# Patient Record
Sex: Female | Born: 1995 | Race: Black or African American | Hispanic: No | Marital: Single | Smoking: Never smoker
Health system: Southern US, Community
[De-identification: ages and names within clinical notes are randomized; demographics above are authoritative.]

## PROBLEM LIST (undated history)

## (undated) HISTORY — PX: NO PAST SURGERIES: SHX2092

---
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
## 2014-01-14 ENCOUNTER — Ambulatory Visit: Payer: Self-pay | Admitting: Pediatrics

## 2015-01-23 ENCOUNTER — Emergency Department: Payer: Self-pay | Admitting: Emergency Medicine

## 2015-01-26 ENCOUNTER — Ambulatory Visit: Payer: Self-pay | Admitting: Pediatrics

## 2015-09-19 ENCOUNTER — Encounter: Payer: Self-pay | Admitting: Emergency Medicine

## 2015-09-19 ENCOUNTER — Ambulatory Visit
Admission: EM | Admit: 2015-09-19 | Discharge: 2015-09-19 | Disposition: A | Discharge status: Home / Self Care | Payer: No Typology Code available for payment source | Attending: Family Medicine | Admitting: Family Medicine

## 2015-09-19 DIAGNOSIS — J029 Acute pharyngitis, unspecified: Secondary | ICD-10-CM | POA: Diagnosis not present

## 2015-09-19 LAB — RAPID STREP SCREEN (MED CTR MEBANE ONLY): Streptococcus, Group A Screen (Direct): NEGATIVE

## 2015-09-19 NOTE — Discharge Instructions (Signed)

## 2015-09-19 NOTE — ED Provider Notes (Signed)
CSN: [id number]     Arrival date & time 09/19/15  1518 History   First MD Initiated Contact with Patient 09/19/15 1617     Chief Complaint  Patient presents with  . Sore Throat  . Cough  . Headache   (Consider location/radiation/quality/duration/timing/severity/associated sxs/prior Treatment) HPI  19 year old female presents to urgent care today with complaints of sore throat, cough, runny nose.  Patient reports that she's had these symptoms since this past Wednesday.  She reports associated tactile fever but has not taken her temperature. She also reports associated chills. No exacerbating or relieving factors. She is in college so she may have had some sick contacts.   History reviewed. No pertinent past medical history.   History reviewed. No pertinent past surgical history.   History reviewed. No pertinent family history. Social History  Substance Use Topics  . Smoking status: Never Smoker   . Smokeless tobacco: None  . Alcohol Use: No   OB History    No data available     Review of Systems  Constitutional: Positive for fever and chills.  HENT: Positive for sore throat.   Respiratory: Positive for cough.   All other systems negative.   Allergies  Review of patient's allergies indicates no known allergies.  Home Medications   Prior to Admission medications   Medication Sig Start Date End Date Taking? Authorizing Provider  norgestimate-ethinyl estradiol (ORTHO-CYCLEN,SPRINTEC,PREVIFEM) 0.25-35 MG-MCG tablet Take 1 tablet by mouth daily.   Yes Historical Provider, MD   Meds Ordered and Administered this Visit  Medications - No data to display  BP 151/75 mmHg  Pulse 100  Temp(Src) 97.5 F (36.4 C) (Oral)  Resp 16  Ht 5\' 7"  (1.702 m)  Wt 220 lb (99.791 kg)  BMI 34.45 kg/m2  SpO2 100%  LMP 09/18/2015 (Exact Date) No data found.  Physical Exam  Constitutional: She appears well-developed and well-nourished. No distress.  HENT:  Head: Normocephalic and  atraumatic.  Normal TMs bilaterally. Oropharynx mildly erythematous.  Eyes: Conjunctivae are normal. Right eye exhibits no discharge. Left eye exhibits no discharge.  Neck: Neck supple.  Cardiovascular: Normal rate and regular rhythm.   Soft systolic murmur.  Pulmonary/Chest: Effort normal. She has no wheezes. She has no rales.  Musculoskeletal: Normal range of motion.  Lymphadenopathy:    She has no cervical adenopathy.  Neurological: She is alert.  Skin: Skin is warm and dry. No rash noted.  Psychiatric: She has a normal mood and affect.  Vitals reviewed.  ED Course  Procedures (including critical care time)  Labs Review Labs Reviewed  RAPID STREP SCREEN (NOT AT [facility])  CULTURE, GROUP A STREP ([facility] ONLY)   Imaging Review No results found.  MDM   1. Pharyngitis    Well-appearing on exam. Exam unremarkable. Rapid strep negative. Sending culture. This is likely viral illness. Advised supportive care with rest, IV Profen and Tylenol for pain/discomfort. Follow-up if worsens or fails to improve.    Tommie SamsJayce G Ulises Wolfinger, DO 09/19/15 [id number]

## 2015-09-19 NOTE — ED Notes (Signed)
Patient c/o sore throat, stuffy nose, HAs, and cough since Wed.

## 2015-09-21 LAB — CULTURE, GROUP A STREP (THRC)

## 2015-09-21 NOTE — ED Notes (Signed)
Final report of strep testing negative  

## 2016-07-16 ENCOUNTER — Emergency Department: Payer: No Typology Code available for payment source

## 2016-07-16 ENCOUNTER — Emergency Department
Admission: EM | Admit: 2016-07-16 | Discharge: 2016-07-16 | Disposition: A | Discharge status: Home / Self Care | Payer: No Typology Code available for payment source | Attending: Emergency Medicine | Admitting: Emergency Medicine

## 2016-07-16 ENCOUNTER — Encounter: Payer: Self-pay | Admitting: Emergency Medicine

## 2016-07-16 DIAGNOSIS — S50812A Abrasion of left forearm, initial encounter: Secondary | ICD-10-CM | POA: Diagnosis not present

## 2016-07-16 DIAGNOSIS — S93401A Sprain of unspecified ligament of right ankle, initial encounter: Secondary | ICD-10-CM | POA: Diagnosis not present

## 2016-07-16 DIAGNOSIS — S20212A Contusion of left front wall of thorax, initial encounter: Secondary | ICD-10-CM | POA: Diagnosis not present

## 2016-07-16 DIAGNOSIS — Y92410 Unspecified street and highway as the place of occurrence of the external cause: Secondary | ICD-10-CM | POA: Diagnosis not present

## 2016-07-16 DIAGNOSIS — Y9389 Activity, other specified: Secondary | ICD-10-CM | POA: Diagnosis not present

## 2016-07-16 DIAGNOSIS — T07XXXA Unspecified multiple injuries, initial encounter: Secondary | ICD-10-CM

## 2016-07-16 DIAGNOSIS — Y999 Unspecified external cause status: Secondary | ICD-10-CM | POA: Diagnosis not present

## 2016-07-16 DIAGNOSIS — M25511 Pain in right shoulder: Secondary | ICD-10-CM | POA: Diagnosis present

## 2016-07-16 MED ORDER — KETOROLAC TROMETHAMINE 10 MG PO TABS: 10.0000 mg | ORAL_TABLET | Freq: Three times a day (TID) | ORAL | 0 refills | Status: DC

## 2016-07-16 MED ORDER — CYCLOBENZAPRINE HCL 5 MG PO TABS: 5.0000 mg | ORAL_TABLET | Freq: Three times a day (TID) | ORAL | 0 refills | Status: DC | PRN

## 2016-07-16 MED ORDER — CYCLOBENZAPRINE HCL 10 MG PO TABS
5.0000 mg | ORAL_TABLET | Freq: Once | ORAL | Status: AC
  Administered 2016-07-16: 5 mg via ORAL
  Filled 2016-07-16: qty 1

## 2016-07-16 MED ORDER — HYDROCODONE-ACETAMINOPHEN 5-325 MG PO TABS: 1.0000 | ORAL_TABLET | Freq: Four times a day (QID) | ORAL | 0 refills | Status: DC | PRN

## 2016-07-16 MED ORDER — KETOROLAC TROMETHAMINE 60 MG/2ML IM SOLN
30.0000 mg | Freq: Once | INTRAMUSCULAR | Status: AC
  Administered 2016-07-16: 30 mg via INTRAMUSCULAR
  Filled 2016-07-16: qty 2

## 2016-07-16 NOTE — Discharge Instructions (Signed)
Take the prescription meds for pain, spasm, and inflammation as directed. Apply ice to any sore or swollen areas. Apply antibiotic ointment to all abrasions, daily, to promote healing. Follow-up with [facility] for any ongoing symptoms.
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
## 2018-06-11 ENCOUNTER — Ambulatory Visit: Payer: BLUE CROSS/BLUE SHIELD | Admitting: Family Medicine

## 2018-06-11 ENCOUNTER — Encounter: Payer: Self-pay | Admitting: Family Medicine

## 2018-06-11 VITALS — BP 120/80 | HR 76 | Ht 65.5 in | Wt 226.0 lb

## 2018-06-11 DIAGNOSIS — Z23 Encounter for immunization: Secondary | ICD-10-CM | POA: Diagnosis not present

## 2018-06-11 DIAGNOSIS — Z3009 Encounter for other general counseling and advice on contraception: Secondary | ICD-10-CM | POA: Diagnosis not present

## 2018-06-11 DIAGNOSIS — N926 Irregular menstruation, unspecified: Secondary | ICD-10-CM | POA: Diagnosis not present

## 2018-06-11 DIAGNOSIS — Z7689 Persons encountering health services in other specified circumstances: Secondary | ICD-10-CM

## 2018-06-11 LAB — POCT URINE PREGNANCY: PREG TEST UR: NEGATIVE

## 2018-06-11 NOTE — Patient Instructions (Signed)
Contraception Choices Contraception, also called birth control, refers to methods or devices that prevent pregnancy. Hormonal methods Contraceptive implant A contraceptive implant is a thin, plastic tube that contains a hormone. It is inserted into the upper part of the arm. It can remain in place for up to 3 years. Progestin-only injections Progestin-only injections are injections of progestin, a synthetic form of the hormone progesterone. They are given every 3 months by a health care provider. Birth control pills Birth control pills are pills that contain hormones that prevent pregnancy. They must be taken once a day, preferably at the same time each day. Birth control patch The birth control patch contains hormones that prevent pregnancy. It is placed on the skin and must be changed once a week for three weeks and removed on the fourth week. A prescription is needed to use this method of contraception. Vaginal ring A vaginal ring contains hormones that prevent pregnancy. It is placed in the vagina for three weeks and removed on the fourth week. After that, the process is repeated with a new ring. A prescription is needed to use this method of contraception. Emergency contraceptive Emergency contraceptives prevent pregnancy after unprotected sex. They come in pill form and can be taken up to 5 days after sex. They work best the sooner they are taken after having sex. Most emergency contraceptives are available without a prescription. This method should not be used as your only form of birth control. Barrier methods Female condom A female condom is a thin sheath that is worn over the penis during sex. Condoms keep sperm from going inside a woman's body. They can be used with a spermicide to increase their effectiveness. They should be disposed after a single use. Female condom A female condom is a soft, loose-fitting sheath that is put into the vagina before sex. The condom keeps sperm from going  inside a woman's body. They should be disposed after a single use. Diaphragm A diaphragm is a soft, dome-shaped barrier. It is inserted into the vagina before sex, along with a spermicide. The diaphragm blocks sperm from entering the uterus, and the spermicide kills sperm. A diaphragm should be left in the vagina for 6-8 hours after sex and removed within 24 hours. A diaphragm is prescribed and fitted by a health care provider. A diaphragm should be replaced every 1-2 years, after giving birth, after gaining more than 15 lb (6.8 kg), and after pelvic surgery. Cervical cap A cervical cap is a round, soft latex or plastic cup that fits over the cervix. It is inserted into the vagina before sex, along with spermicide. It blocks sperm from entering the uterus. The cap should be left in place for 6-8 hours after sex and removed within 48 hours. A cervical cap must be prescribed and fitted by a health care provider. It should be replaced every 2 years. Sponge A sponge is a soft, circular piece of polyurethane foam with spermicide on it. The sponge helps block sperm from entering the uterus, and the spermicide kills sperm. To use it, you make it wet and then insert it into the vagina. It should be inserted before sex, left in for at least 6 hours after sex, and removed and thrown away within 30 hours. Spermicides Spermicides are chemicals that kill or block sperm from entering the cervix and uterus. They can come as a cream, jelly, suppository, foam, or tablet. A spermicide should be inserted into the vagina with an applicator at least 10-15 minutes before   sex to allow time for it to work. The process must be repeated every time you have sex. Spermicides do not require a prescription. Intrauterine contraception Intrauterine device (IUD) An IUD is a T-shaped device that is put in a woman's uterus. There are two types:  Hormone IUD.This type contains progestin, a synthetic form of the hormone progesterone. This  type can stay in place for 3-5 years.  Copper IUD.This type is wrapped in copper wire. It can stay in place for 10 years.  Permanent methods of contraception Female tubal ligation In this method, a woman's fallopian tubes are sealed, tied, or blocked during surgery to prevent eggs from traveling to the uterus. Hysteroscopic sterilization In this method, a small, flexible insert is placed into each fallopian tube. The inserts cause scar tissue to form in the fallopian tubes and block them, so sperm cannot reach an egg. The procedure takes about 3 months to be effective. Another form of birth control must be used during those 3 months. Female sterilization This is a procedure to tie off the tubes that carry sperm (vasectomy). After the procedure, the man can still ejaculate fluid (semen). Natural planning methods Natural family planning In this method, a couple does not have sex on days when the woman could become pregnant. Calendar method This means keeping track of the length of each menstrual cycle, identifying the days when pregnancy can happen, and not having sex on those days. Ovulation method In this method, a couple avoids sex during ovulation. Symptothermal method This method involves not having sex during ovulation. The woman typically checks for ovulation by watching changes in her temperature and in the consistency of cervical mucus. Post-ovulation method In this method, a couple waits to have sex until after ovulation. Summary  Contraception, also called birth control, means methods or devices that prevent pregnancy.  Hormonal methods of contraception include implants, injections, pills, patches, vaginal rings, and emergency contraceptives.  Barrier methods of contraception can include female condoms, female condoms, diaphragms, cervical caps, sponges, and spermicides.  There are two types of IUDs (intrauterine devices). An IUD can be put in a woman's uterus to prevent pregnancy  for 3-5 years.  Permanent sterilization can be done through a procedure for males, females, or both.  Natural family planning methods involve not having sex on days when the woman could become pregnant. This information is not intended to replace advice given to you by your health care provider. Make sure you discuss any questions you have with your health care provider. Document Released: 10/31/2005 Document Revised: 12/03/2016 Document Reviewed: 12/03/2016 Elsevier Interactive Patient Education  2018 [employer] Inc[employer]  

## 2018-06-11 NOTE — Progress Notes (Signed)
Name: Angelica Fernandez   MRN: [id number]    DOB: 16-Aug-1996   Date:06/11/2018       Progress Note  Subjective  Chief Complaint  Chief Complaint  Patient presents with  . Establish Care    needed pcp  . Contraception    referral to discuss birth control options other than pills    Patient is a 22 year old female who presents to establish care with new primary care physician. The patient reports the following problems: desires change in contraceptive management.. Health maintenance has been reviewed needs t dap.   No problem-specific Assessment & Plan notes found for this encounter.   History reviewed. No pertinent past medical history.  History reviewed. No pertinent surgical history.  History reviewed. No pertinent family history.  Social History   Socioeconomic History  . Marital status: Single    Spouse name: Not on file  . Number of children: Not on file  . Years of education: Not on file  . Highest education level: Not on file  Occupational History  . Not on file  Social Needs  . Financial resource strain: Not on file  . Food insecurity:    Worry: Not on file    Inability: Not on file  . Transportation needs:    Medical: Not on file    Non-medical: Not on file  Tobacco Use  . Smoking status: Never Smoker  . Smokeless tobacco: Never Used  Substance and Sexual Activity  . Alcohol use: Yes  . Drug use: Never  . Sexual activity: Yes  Lifestyle  . Physical activity:    Days per week: Not on file    Minutes per session: Not on file  . Stress: Not on file  Relationships  . Social connections:    Talks on phone: Not on file    Gets together: Not on file    Attends religious service: Not on file    Active member of club or organization: Not on file    Attends meetings of clubs or organizations: Not on file    Relationship status: Not on file  . Intimate partner violence:    Fear of current or ex partner: Not on file    Emotionally abused: Not on file     Physically abused: Not on file    Forced sexual activity: Not on file  Other Topics Concern  . Not on file  Social History Narrative  . Not on file    No Known Allergies  Outpatient Medications Prior to Visit  Medication Sig Dispense Refill  . cyclobenzaprine (FLEXERIL) 5 MG tablet Take 1 tablet (5 mg total) by mouth 3 (three) times daily as needed for muscle spasms. 15 tablet 0  . HYDROcodone-acetaminophen (NORCO) 5-325 MG tablet Take 1 tablet by mouth every 6 (six) hours as needed. 10 tablet 0  . ketorolac (TORADOL) 10 MG tablet Take 1 tablet (10 mg total) by mouth every 8 (eight) hours. 15 tablet 0  . norgestimate-ethinyl estradiol (ORTHO-CYCLEN,SPRINTEC,PREVIFEM) 0.25-35 MG-MCG tablet Take 1 tablet by mouth daily.     No facility-administered medications prior to visit.     Review of Systems  Constitutional: Negative for chills, fever, malaise/fatigue and weight loss.  HENT: Negative for ear discharge, ear pain and sore throat.   Eyes: Negative for blurred vision.  Respiratory: Negative for cough, sputum production, shortness of breath and wheezing.   Cardiovascular: Negative for chest pain, palpitations and leg swelling.  Gastrointestinal: Negative for abdominal pain, blood  in stool, constipation, diarrhea, heartburn, melena and nausea.  Genitourinary: Negative for dysuria, frequency, hematuria and urgency.  Musculoskeletal: Negative for back pain, joint pain, myalgias and neck pain.  Skin: Negative for rash.  Neurological: Negative for dizziness, tingling, sensory change, focal weakness and headaches.  Endo/Heme/Allergies: Negative for environmental allergies and polydipsia. Does not bruise/bleed easily.  Psychiatric/Behavioral: Negative for depression and suicidal ideas. The patient is not nervous/anxious and does not have insomnia.      Objective  Vitals:   06/11/18 1530  BP: 120/80  Pulse: 76  Weight: 226 lb (102.5 kg)  Height: 5' 5.5" (1.664 m)    Physical  Exam  Constitutional: She is oriented to person, place, and time. She appears well-developed and well-nourished.  HENT:  Head: Normocephalic.  Right Ear: External ear normal.  Left Ear: External ear normal.  Mouth/Throat: Oropharynx is clear and moist.  Eyes: Pupils are equal, round, and reactive to light. Conjunctivae and EOM are normal. Lids are everted and swept, no foreign bodies found. Left eye exhibits no hordeolum. No foreign body present in the left eye. Right conjunctiva is not injected. Left conjunctiva is not injected. No scleral icterus.  Neck: Normal range of motion. Neck supple. No JVD present. No tracheal deviation present. No thyromegaly present.  Cardiovascular: Normal rate, regular rhythm, normal heart sounds and intact distal pulses. Exam reveals no gallop and no friction rub.  No murmur heard. Pulmonary/Chest: Effort normal and breath sounds normal. No respiratory distress. She has no wheezes. She has no rales.  Abdominal: Soft. Bowel sounds are normal. She exhibits no mass. There is no hepatosplenomegaly. There is no tenderness. There is no rebound and no guarding.  Musculoskeletal: Normal range of motion. She exhibits no edema or tenderness.  Lymphadenopathy:    She has no cervical adenopathy.  Neurological: She is alert and oriented to person, place, and time. She has normal strength. She displays normal reflexes. No cranial nerve deficit.  Skin: Skin is warm. No rash noted.  Psychiatric: She has a normal mood and affect. Her mood appears not anxious. She does not exhibit a depressed mood.  Nursing note and vitals reviewed.     Assessment & Plan  Problem List Items Addressed This Visit    None    Visit Diagnoses    Establishing care with new doctor, encounter for    -  [facility]   Encounter for other general counseling or advice on contraception       Patient with issues concerning current OCP management. Would  like to discuss alternative means of contraception possibly patch . Information given and referral.   Relevant Orders   Ambulatory referral to Gynecology   Irregular periods       Referral to gyn for evaluation and management.   Relevant Orders   POCT urine pregnancy (Completed)   Need for Tdap vaccination       T dap needed and provided /pregnancy test negative.   Relevant Orders   Tdap vaccine greater than or equal to 7yo IM (Completed)      No orders of the defined types were placed in this encounter.     Dr. Hayden Rasmusseneanna Kendahl Bumgardner [facility]  06/11/18

## 2018-09-21 ENCOUNTER — Ambulatory Visit: Payer: BLUE CROSS/BLUE SHIELD

## 2018-09-24 ENCOUNTER — Ambulatory Visit (INDEPENDENT_AMBULATORY_CARE_PROVIDER_SITE_OTHER): Payer: BLUE CROSS/BLUE SHIELD

## 2018-09-24 DIAGNOSIS — Z23 Encounter for immunization: Secondary | ICD-10-CM | POA: Diagnosis not present

## 2019-05-31 ENCOUNTER — Encounter: Payer: Self-pay | Admitting: Physician Assistant

## 2019-05-31 ENCOUNTER — Ambulatory Visit: Payer: Self-pay | Admitting: Physician Assistant

## 2019-05-31 ENCOUNTER — Other Ambulatory Visit: Payer: Self-pay

## 2019-05-31 DIAGNOSIS — Z113 Encounter for screening for infections with a predominantly sexual mode of transmission: Secondary | ICD-10-CM

## 2019-05-31 DIAGNOSIS — Z202 Contact with and (suspected) exposure to infections with a predominantly sexual mode of transmission: Secondary | ICD-10-CM

## 2019-05-31 LAB — WET PREP FOR TRICH, YEAST, CLUE
Trichomonas Exam: NEGATIVE
Yeast Exam: NEGATIVE

## 2019-05-31 MED ORDER — CEFTRIAXONE SODIUM 250 MG IJ SOLR
250.0000 mg | Freq: Once | INTRAMUSCULAR | Status: AC
  Administered 2019-05-31: 250 mg via INTRAMUSCULAR

## 2019-05-31 MED ORDER — AZITHROMYCIN 500 MG PO TABS
500.0000 mg | ORAL_TABLET | Freq: Once | ORAL | Status: AC
  Administered 2019-05-31: 500 mg via ORAL

## 2019-05-31 NOTE — Progress Notes (Signed)
STI clinic/screening visit  Subjective:  Angelica Fernandez is a 23 y.o. female being seen today for an STI screening visit. The patient reports they do have symptoms.  Patient has the following medical conditionsdoes not have a problem list on file.  Chief Complaint  Patient presents with  . SEXUALLY TRANSMITTED DISEASE    Patient reports that a partner told her that he tested positive for GC and Chlamydia.  Patient reports bleeding after sex for last 2 weeks and cramping off and on for the last week.  Denies other symptoms. HPI   See flowsheet for further details and programmatic requirements.    The following portions of the patient's history were reviewed and updated as appropriate: allergies, current medications, past family history, past medical history, past social history, past surgical history and problem list. Problem list updated.  Objective:  There were no vitals filed for this visit.  Physical Exam Constitutional:      General: She is not in acute distress.    Appearance: Normal appearance.  HENT:     Head: Normocephalic and atraumatic.     Mouth/Throat:     Mouth: Mucous membranes are moist.     Pharynx: Oropharynx is clear. No oropharyngeal exudate or posterior oropharyngeal erythema.  Neck:     Musculoskeletal: Neck supple.  Pulmonary:     Effort: Pulmonary effort is normal.  Abdominal:     Palpations: Abdomen is soft. There is no mass.     Tenderness: There is no abdominal tenderness. There is no guarding or rebound.  Genitourinary:    General: Normal vulva.     Rectum: Normal.     Comments: External genitalia/pubic area is normal female without nits, lice, edema, erythema, lesions and inguinal adenopathy. Vagina with normal mucosa, a small amount of pinkish/bloody discharge,  Cervix without visible lesions. Uterus normal size, firm, mobile, nt, no CMT, no masses, no adnexal tenderness or fullness. Lymphadenopathy:     Cervical: No cervical  adenopathy.  Skin:    General: Skin is warm and dry.     Findings: No bruising, erythema, lesion or rash.  Neurological:     Mental Status: She is alert and oriented to person, place, and time.  Psychiatric:        Mood and Affect: Mood normal.        Behavior: Behavior normal.        Thought Content: Thought content normal.        Judgment: Judgment normal.       Assessment and Plan:  Angelica Fernandez is a 23 y.o. female presenting to the [facility] Department for STI screening  1. Screening for STD (sexually transmitted disease) Patient is a contact to [facility] and Chlamydia and requests testing today. Declines all blood work today. Rec condoms with all sex Await test results.  Counseled that RN would call to let her know re:  Test results and check to make sure she had no problems with medicines. - WET PREP FOR TRICH, YEAST, CLUE - Chlamydia/Gonorrhea [place] [facility] - azithromycin (ZITHROMAX) tablet 500 mg - cefTRIAXone (ROCEPHIN) injection 250 mg  2. Venereal disease contact Treat with Ceftriaxone 250mg  IM and Azithromcyin 1g po DOT today No sex for 7 days and until after partner completes treatment Rec condoms with all sex RTC if vomits < 2 hr after taking medicine for re treatment. - azithromycin (ZITHROMAX) tablet 500 mg - cefTRIAXone (ROCEPHIN) injection 250 mg  No follow-ups on file.  No future appointments.  Jerene Dilling, PA

## 2019-05-31 NOTE — Progress Notes (Signed)
Patient treated as contact to Gonorrhea, per provider orders.Jenetta Downer, RN

## 2019-06-07 ENCOUNTER — Telehealth: Payer: Self-pay

## 2019-06-07 NOTE — Telephone Encounter (Signed)
TC to patient. Verified ID via password. Informed of +chlamydia. Patient tx'd at visit on 05/31/19 and tolerated well. Instructed to RTC in 2-3 months for TOC. Patient verbalized understanding. CD report completed. Aileen Fass, RN

## 2019-07-03 ENCOUNTER — Other Ambulatory Visit: Payer: Self-pay

## 2019-07-03 ENCOUNTER — Ambulatory Visit
Admission: EM | Admit: 2019-07-03 | Discharge: 2019-07-03 | Disposition: A | Discharge status: Home / Self Care | Payer: BLUE CROSS/BLUE SHIELD | Attending: Family Medicine | Admitting: Family Medicine

## 2019-07-03 DIAGNOSIS — N390 Urinary tract infection, site not specified: Secondary | ICD-10-CM

## 2019-07-03 LAB — URINALYSIS, COMPLETE (UACMP) WITH MICROSCOPIC
Bilirubin Urine: NEGATIVE
Glucose, UA: NEGATIVE mg/dL
Hgb urine dipstick: NEGATIVE
Ketones, ur: NEGATIVE mg/dL
Nitrite: NEGATIVE
Protein, ur: NEGATIVE mg/dL
Specific Gravity, Urine: 1.02 (ref 1.005–1.030)
WBC, UA: >50 WBC/hpf (ref 0–5)
pH: 6.5 (ref 5.0–8.0)

## 2019-07-03 MED ORDER — CEPHALEXIN 500 MG PO CAPS: 500.0000 mg | ORAL_CAPSULE | Freq: Two times a day (BID) | ORAL | 0 refills | Status: DC

## 2019-07-03 NOTE — ED Triage Notes (Signed)
Patient complains of urinary urgency, frequency and bilateral flank pain. Patient states that she doesn't feel like she is emptying her bladder fully.

## 2019-07-03 NOTE — ED Provider Notes (Signed)
[facility]    CSN: [id number] Arrival date & time: 07/03/19  1228  History   Chief Complaint Chief Complaint  Patient presents with  . Flank Pain   HPI  23 year old female presents with urinary symptoms.  Patient reports her symptoms started last week.  She reports urinary urgency and frequency.  She reports back pain as well.  No fever.  No abdominal pain.  No known inciting factor.  No concerns for STDs.  No known exacerbating factors.  She is concerned that she has UTI.  No other complaints.  PMH, Surgical Hx, Family Hx, Social History reviewed and updated as below.  PMH: Irregular Menses  No surgical Hx.  Home Medications    Prior to Admission medications   Medication Sig Start Date End Date Taking? Authorizing Provider  cephALEXin (KEFLEX) 500 MG capsule Take 1 capsule (500 mg total) by mouth 2 (two) times daily. 07/03/19   Tommie Samsook, Javon Hupfer G, DO    Family History Family History  Problem Relation Age of Onset  . Healthy Mother   . Healthy Father     Social History Social History   Tobacco Use  . Smoking status: Never Smoker  . Smokeless tobacco: Never Used  Substance Use Topics  . Alcohol use: Yes    Alcohol/week: 2.0 standard drinks    Types: 2 Glasses of wine per week    Comment: occasionally  . Drug use: Never     Allergies   Patient has no known allergies.   Review of Systems Review of Systems  Constitutional: Negative.   Gastrointestinal: Negative.   Genitourinary: Positive for frequency and urgency. Negative for dysuria.  Musculoskeletal: Positive for back pain.   Physical Exam Triage Vital Signs ED Triage Vitals  Enc Vitals Group     BP 07/03/19 1249 (!) 141/64     Pulse --      Resp 07/03/19 1249 18     Temp 07/03/19 1249 98.6 F (37 C)     Temp Source 07/03/19 1249 Oral     SpO2 07/03/19 1249 99 %     Weight 07/03/19 1250 195 lb (88.5 kg)     Height 07/03/19 1250 5\' 5"  (1.651 m)     Head Circumference --      Peak Flow  --      Pain Score 07/03/19 1249 5     Pain Loc --      Pain Edu? --      Excl. in GC? --    Updated Vital Signs BP (!) 141/64 (BP Location: Right Arm)   Temp 98.6 F (37 C) (Oral)   Resp 18   Ht 5\' 5"  (1.651 m)   Wt 88.5 kg   LMP 06/11/2019   SpO2 99%   BMI 32.45 kg/m   Visual Acuity Right Eye Distance:   Left Eye Distance:   Bilateral Distance:    Right Eye Near:   Left Eye Near:    Bilateral Near:     Physical Exam Vitals signs and nursing note reviewed.  Constitutional:      General: She is not in acute distress.    Appearance: Normal appearance.  HENT:     Head: Normocephalic and atraumatic.  Eyes:     General:        Right eye: No discharge.        Left eye: No discharge.     Conjunctiva/sclera: Conjunctivae normal.  Cardiovascular:     Rate and Rhythm: Normal rate  and regular rhythm.  Pulmonary:     Effort: Pulmonary effort is normal.     Breath sounds: No wheezing or rales.  Abdominal:     General: There is no distension.     Palpations: Abdomen is soft.     Tenderness: There is no abdominal tenderness.  Neurological:     Mental Status: She is alert.  Psychiatric:        Mood and Affect: Mood normal.        Behavior: Behavior normal.    UC Treatments / Results  Labs (all labs ordered are listed, but only abnormal results are displayed) Labs Reviewed  URINALYSIS, COMPLETE (UACMP) WITH MICROSCOPIC - Abnormal; Notable for the following components:      Result Value   APPearance CLOUDY (*)    Leukocytes,Ua MODERATE (*)    Bacteria, UA FEW (*)    All other components within normal limits  URINE CULTURE    EKG   Radiology No results found.  Procedures Procedures (including critical care time)  Medications Ordered in UC Medications - No data to display  Initial Impression / Assessment and Plan / UC Course  I have reviewed the triage vital signs and the nursing notes.  Pertinent labs & imaging results that were available during my  care of the patient were reviewed by me and considered in my medical decision making (see chart for details).    23 year old female presents with UTI.  Sending culture.  Placing on Keflex.  Final Clinical Impressions(s) / UC Diagnoses   Final diagnoses:  Urinary tract infection without hematuria, site unspecified     Discharge Instructions     Antibiotic as prescribed.  Take care  Dr. Lacinda Axon   ED Prescriptions    Medication Sig Dispense Auth. Provider   cephALEXin (KEFLEX) 500 MG capsule Take 1 capsule (500 mg total) by mouth 2 (two) times daily. 14 capsule Coral Spikes, DO     Controlled Substance Prescriptions [place] Controlled Substance Registry consulted? Not Applicable   Coral Spikes, DO 07/03/19 1338

## 2019-07-03 NOTE — Discharge Instructions (Signed)
Antibiotic as prescribed.  Take care  Dr. Niana Martorana  

## 2019-07-04 LAB — URINE CULTURE

## 2019-07-09 ENCOUNTER — Other Ambulatory Visit: Payer: Self-pay

## 2019-07-26 ENCOUNTER — Ambulatory Visit (LOCAL_COMMUNITY_HEALTH_CENTER): Payer: Self-pay

## 2019-07-26 ENCOUNTER — Other Ambulatory Visit: Payer: Self-pay

## 2019-07-26 DIAGNOSIS — Z111 Encounter for screening for respiratory tuberculosis: Secondary | ICD-10-CM

## 2019-07-29 ENCOUNTER — Ambulatory Visit (LOCAL_COMMUNITY_HEALTH_CENTER): Payer: Self-pay

## 2019-07-29 ENCOUNTER — Other Ambulatory Visit: Payer: Self-pay

## 2019-07-29 DIAGNOSIS — Z111 Encounter for screening for respiratory tuberculosis: Secondary | ICD-10-CM

## 2019-07-29 LAB — TB SKIN TEST
Induration: 0 mm
TB Skin Test: NEGATIVE

## 2019-09-30 ENCOUNTER — Ambulatory Visit
Admission: EM | Admit: 2019-09-30 | Discharge: 2019-09-30 | Disposition: A | Discharge status: Home / Self Care | Payer: Self-pay | Attending: Family Medicine | Admitting: Family Medicine

## 2019-09-30 ENCOUNTER — Encounter: Payer: Self-pay | Admitting: Emergency Medicine

## 2019-09-30 ENCOUNTER — Other Ambulatory Visit: Payer: Self-pay

## 2019-09-30 DIAGNOSIS — S39012A Strain of muscle, fascia and tendon of lower back, initial encounter: Secondary | ICD-10-CM

## 2019-09-30 DIAGNOSIS — R519 Headache, unspecified: Secondary | ICD-10-CM

## 2019-09-30 DIAGNOSIS — S161XXA Strain of muscle, fascia and tendon at neck level, initial encounter: Secondary | ICD-10-CM

## 2019-09-30 MED ORDER — CYCLOBENZAPRINE HCL 5 MG PO TABS: 5.0000 mg | ORAL_TABLET | Freq: Every day | ORAL | 0 refills | Status: DC

## 2019-09-30 MED ORDER — HYDROCODONE-ACETAMINOPHEN 5-325 MG PO TABS: ORAL_TABLET | ORAL | 0 refills | Status: DC
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
## 2019-09-30 NOTE — Discharge Instructions (Signed)
Rest, ice/heat, tylenol/advil as needed 
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
## 2019-10-16 ENCOUNTER — Other Ambulatory Visit: Payer: Self-pay | Admitting: Chiropractor

## 2019-10-16 ENCOUNTER — Ambulatory Visit
Admission: RE | Admit: 2019-10-16 | Discharge: 2019-10-16 | Disposition: A | Discharge status: Home / Self Care | Payer: Self-pay | Attending: Chiropractor | Admitting: Chiropractor

## 2019-10-16 ENCOUNTER — Ambulatory Visit
Admission: RE | Admit: 2019-10-16 | Discharge: 2019-10-16 | Disposition: A | Discharge status: Home / Self Care | Payer: Self-pay | Source: Ambulatory Visit | Attending: Chiropractor | Admitting: Chiropractor

## 2019-10-16 ENCOUNTER — Other Ambulatory Visit: Payer: Self-pay

## 2019-10-16 DIAGNOSIS — S338XXA Sprain of other parts of lumbar spine and pelvis, initial encounter: Secondary | ICD-10-CM

## 2019-10-16 DIAGNOSIS — S134XXA Sprain of ligaments of cervical spine, initial encounter: Secondary | ICD-10-CM | POA: Insufficient documentation

## 2019-10-16 DIAGNOSIS — S233XXA Sprain of ligaments of thoracic spine, initial encounter: Secondary | ICD-10-CM

## 2020-02-22 ENCOUNTER — Ambulatory Visit: Payer: Self-pay | Attending: Internal Medicine

## 2020-02-22 DIAGNOSIS — Z23 Encounter for immunization: Secondary | ICD-10-CM

## 2020-02-22 NOTE — Progress Notes (Signed)
   Covid-19 Vaccination Clinic  Name:  Keita Demarco    MRN: [id number] DOB: 1996-05-10  02/22/2020  Ms. Hett was observed post Covid-19 immunization for 15 minutes without incident. She was provided with Vaccine Information Sheet and instruction to access the V-Safe system.   Ms. Leisure was instructed to call 911 with any severe reactions post vaccine: Marland Kitchen Difficulty breathing  . Swelling of face and throat  . A fast heartbeat  . A bad rash all over body  . Dizziness and weakness   Immunizations Administered    Name Date Dose VIS Date Route   Pfizer COVID-19 Vaccine 02/22/2020  9:49 AM 0.3 mL 10/25/2019 Intramuscular   Manufacturer: [employer], [employer]   Lot: [id number]   NDC: [address]-6

## 2020-03-11 ENCOUNTER — Ambulatory Visit: Payer: Self-pay

## 2020-03-11 ENCOUNTER — Ambulatory Visit: Payer: Self-pay | Admitting: Family Medicine

## 2020-03-11 ENCOUNTER — Encounter: Payer: Self-pay | Admitting: Family Medicine

## 2020-03-11 ENCOUNTER — Other Ambulatory Visit: Payer: Self-pay

## 2020-03-11 ENCOUNTER — Ambulatory Visit (LOCAL_COMMUNITY_HEALTH_CENTER): Payer: Self-pay

## 2020-03-11 DIAGNOSIS — Z113 Encounter for screening for infections with a predominantly sexual mode of transmission: Secondary | ICD-10-CM

## 2020-03-11 DIAGNOSIS — Z23 Encounter for immunization: Secondary | ICD-10-CM

## 2020-03-11 DIAGNOSIS — B9689 Other specified bacterial agents as the cause of diseases classified elsewhere: Secondary | ICD-10-CM

## 2020-03-11 DIAGNOSIS — N760 Acute vaginitis: Secondary | ICD-10-CM

## 2020-03-11 LAB — WET PREP FOR TRICH, YEAST, CLUE
Trichomonas Exam: NEGATIVE
Yeast Exam: NEGATIVE

## 2020-03-11 MED ORDER — METRONIDAZOLE 500 MG PO TABS: 500.0000 mg | ORAL_TABLET | Freq: Two times a day (BID) | ORAL | 0 refills | Status: AC

## 2020-03-11 NOTE — Progress Notes (Signed)
[facility] Department STI clinic/screening visit  Subjective:  Angelica Fernandez is a 24 y.o. female being seen today for  Chief Complaint  Patient presents with  . SEXUALLY TRANSMITTED DISEASE     The patient reports they do not have symptoms. Patient reports that they do not desire a pregnancy in the next year. They reported they are not interested in discussing contraception today.   Patient has the following medical conditions:  There are no problems to display for this patient.   HPI  Pt reports she is here for STI screen. She has had BV in the past, wondering if it is back, notices a slight odor sometimes.  See flowsheet for further details and programmatic requirements.    Patient's last menstrual period was 03/03/2020 (exact date). Last sex: yesterday BCM: OCP Desires EC? n/a   No components found for: HCV  The following portions of the patient's history were reviewed and updated as appropriate: allergies, current medications, past medical history, past social history, past surgical history and problem list.  Objective:  There were no vitals filed for this visit.   Physical Exam Vitals and nursing note reviewed.  Constitutional:      Appearance: Normal appearance.  HENT:     Head: Normocephalic and atraumatic.     Mouth/Throat:     Mouth: Mucous membranes are moist.     Pharynx: Oropharynx is clear. No oropharyngeal exudate or posterior oropharyngeal erythema.  Pulmonary:     Effort: Pulmonary effort is normal.  Abdominal:     General: Abdomen is flat.     Palpations: There is no mass.     Tenderness: There is no abdominal tenderness. There is no rebound.  Genitourinary:    General: Normal vulva.     Exam position: Lithotomy position.     Pubic Area: No rash or pubic lice.      Labia:        Right: No rash or lesion.        Left: No rash or lesion.      Vagina: Vaginal discharge (white, ph>4.5) present. No erythema, bleeding or  lesions.     Cervix: No cervical motion tenderness, discharge, friability, lesion or erythema.     Uterus: Normal.      Adnexa: Right adnexa normal and left adnexa normal.     Rectum: Normal.  Lymphadenopathy:     Head:     Right side of head: No preauricular or posterior auricular adenopathy.     Left side of head: No preauricular or posterior auricular adenopathy.     Cervical: No cervical adenopathy.     Upper Body:     Right upper body: No supraclavicular or axillary adenopathy.     Left upper body: No supraclavicular or axillary adenopathy.     Lower Body: No right inguinal adenopathy. No left inguinal adenopathy.  Skin:    General: Skin is warm and dry.     Findings: No rash.  Neurological:     Mental Status: She is alert and oriented to person, place, and time.      Assessment and Plan:  Angelica Fernandez is a 24 y.o. female presenting to the [facility] Department for STI screening   1. Screening examination for venereal disease -Pt with symptoms. Screenings today as below. Treat wet prep per standing order. -Patient does not meet criteria for HepB, HepC Screening. Declines HIV and syphilis screenings. -Counseled on warning s/sx and when to  seek care. Recommended condom use with all sex and discussed importance of condom use for STI prevention. - WET PREP FOR [place], YEAST, [facility]     Return if symptoms worsen or fail to improve.  Future Appointments  Date Time Provider [place]  03/13/2020  1:40 PM Juline Patch, MD [facility]  03/18/2020 10:00 AM [facility]    Kandee Keen, PA-C

## 2020-03-11 NOTE — Progress Notes (Signed)
Wet mount reviewed and per verbal order of Jenna Staples PA-C, treat for BV per Metronidazole in standing order. Given Steps to Prevent Bacterial Vaginosis handout printed by provider. Jossie Ng, RN

## 2020-03-13 ENCOUNTER — Encounter: Payer: Self-pay | Admitting: Family Medicine

## 2020-03-13 ENCOUNTER — Ambulatory Visit: Payer: Self-pay | Admitting: Family Medicine

## 2020-03-13 ENCOUNTER — Other Ambulatory Visit: Payer: Self-pay

## 2020-03-18 ENCOUNTER — Ambulatory Visit: Payer: Self-pay | Admitting: Physician Assistant

## 2020-03-18 ENCOUNTER — Telehealth: Payer: Self-pay

## 2020-03-18 ENCOUNTER — Ambulatory Visit: Payer: Self-pay | Attending: Internal Medicine

## 2020-03-18 ENCOUNTER — Other Ambulatory Visit: Payer: Self-pay

## 2020-03-18 DIAGNOSIS — A5609 Other chlamydial infection of lower genitourinary tract: Secondary | ICD-10-CM

## 2020-03-18 DIAGNOSIS — A5602 Chlamydial vulvovaginitis: Secondary | ICD-10-CM

## 2020-03-18 MED ORDER — AZITHROMYCIN 500 MG PO TABS
1000.0000 mg | ORAL_TABLET | Freq: Once | ORAL | Status: AC
  Administered 2020-03-18: 1000 mg via ORAL

## 2020-03-18 NOTE — Progress Notes (Signed)
   Covid-19 Vaccination Clinic  Name:  Angelica Fernandez    MRN: [id number] DOB: [birth year]  03/18/2020  [name] was observed post Covid-19 immunization for 15 minutes without incident. She was provided with Vaccine Information Sheet and instruction to access the V-Safe system.   [name] was instructed to call 911 with any severe reactions post vaccine: Marland Kitchen Difficulty breathing  . Swelling of face and throat  . A fast heartbeat  . A bad rash all over body  . Dizziness and weakness   Immunizations Administered    Name Date Dose VIS Date Route   Pfizer COVID-19 Vaccine 03/18/2020 10:13 AM 0.3 mL 01/08/2019 Intramuscular   Manufacturer: [employer], [employer]   Lot: [id number]   NDC: [address]-7

## 2020-03-18 NOTE — Progress Notes (Signed)
S: Patient into clinic for treatment.  States that she received a call that she needs treatment for Chlamydia.  Reports that she has an appointment this pm, but gave someone a ride here this am and would like to go ahead and be treated.  Denies any symptoms today.  Reports that she does not have any allergies.  Last sex 03/17/2020. O:  WDWN female, NAD, A&O x 3, pleasant and cooperative, normal work of breathing. A/P: 1.  Patient needs treatment for Chlamyia. 2.  Azithromycin 1g po DOT given to patient today. 3.  Counseled patient re:  Risks, benefits, and SE of medication and when to call clinic for concerns. 4.  No sex for 7 days and until after partner completes treatment. 5.  Rec condoms with all sex. 6.  RTC for re-treatment if vomits < 2 hr after taking medicine. 7.  RTC for re-screening in 4-12 weeks.

## 2020-03-18 NOTE — Telephone Encounter (Signed)
TC to patient. Verified ID via password/SS#. Informed of positive chlamydia and need for tx. Instructed to eat before visit and have partner call for tx appt. Appt scheduled.Lowen Barringer, RN    

## 2020-06-19 ENCOUNTER — Telehealth: Payer: Self-pay

## 2020-06-19 ENCOUNTER — Ambulatory Visit: Payer: Self-pay

## 2020-06-19 NOTE — Telephone Encounter (Signed)
Client scheduled for flu clinic appt this pm. Call to client to notify her agency does not have any flu vaccine as expired 05/13/2020. Counseled could call local pharmacies to ask when they will have 2021 - 2022 flu vaccine available, but did not yet know when [facility] would have vaccine. Numbers to call x2 provided. Jossie Ng, RN
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
## 2021-06-25 ENCOUNTER — Ambulatory Visit (LOCAL_COMMUNITY_HEALTH_CENTER): Payer: Self-pay | Admitting: Nurse Practitioner

## 2021-06-25 ENCOUNTER — Other Ambulatory Visit: Payer: Self-pay

## 2021-06-25 ENCOUNTER — Ambulatory Visit: Payer: Self-pay | Admitting: Physician Assistant

## 2021-06-25 DIAGNOSIS — Z113 Encounter for screening for infections with a predominantly sexual mode of transmission: Secondary | ICD-10-CM

## 2021-06-25 DIAGNOSIS — Z111 Encounter for screening for respiratory tuberculosis: Secondary | ICD-10-CM

## 2021-06-25 LAB — WET PREP FOR TRICH, YEAST, CLUE
Trichomonas Exam: NEGATIVE
Yeast Exam: NEGATIVE

## 2021-06-25 NOTE — Progress Notes (Signed)
25 year old female in clinic today for a PPD placement. Glenna Fellows, RN

## 2021-06-25 NOTE — Progress Notes (Signed)
Pt here for STD screening.  Wet mount results reviewed, no treatment required. Pt given condoms. Shloima Clinch M Verley Pariseau, RN  

## 2021-06-26 ENCOUNTER — Encounter: Payer: Self-pay | Admitting: Physician Assistant

## 2021-06-26 NOTE — Progress Notes (Signed)
  [facility] Department STI clinic/screening visit  Subjective:  Angelica Fernandez is a 25 y.o. female being seen today for an STI screening visit. The patient reports they do not have symptoms.  Patient reports that they do not desire a pregnancy in the next year.   They reported they are not interested in discussing contraception today.  Patient's last menstrual period was 05/27/2021.   Patient has the following medical conditions:  There are no problems to display for this patient.   Chief Complaint  Patient presents with   SEXUALLY TRANSMITTED DISEASE    screening    HPI  Patient reports that she is not having any symptoms but would like a screening today.  Denies chronic conditions, surgeries and regular medicines.  LMP was 05/27/2021 and using condoms as BCM.  States last HIV test was 08/2020 and last pap was in 2019.    See flowsheet for further details and programmatic requirements.    The following portions of the patient's history were reviewed and updated as appropriate: allergies, current medications, past medical history, past social history, past surgical history and problem list.  Objective:  There were no vitals filed for this visit.  Physical Exam Constitutional:      General: She is not in acute distress.    Appearance: Normal appearance.  HENT:     Head: Normocephalic and atraumatic.     Comments: No nits,lice, or hair loss. No cervical, supraclavicular or axillary adenopathy.     Mouth/Throat:     Mouth: Mucous membranes are moist.     Pharynx: Oropharynx is clear. No oropharyngeal exudate or posterior oropharyngeal erythema.  Eyes:     Conjunctiva/sclera: Conjunctivae normal.  Pulmonary:     Effort: Pulmonary effort is normal.  Musculoskeletal:     Cervical back: Neck supple. No tenderness.  Lymphadenopathy:     Cervical: No cervical adenopathy.  Skin:    General: Skin is warm and dry.     Findings: No bruising, erythema, lesion  or rash.  Neurological:     Mental Status: She is alert and oriented to person, place, and time.  Psychiatric:        Mood and Affect: Mood normal.        Behavior: Behavior normal.        Thought Content: Thought content normal.        Judgment: Judgment normal.     Assessment and Plan:  Angelica Fernandez is a 25 y.o. female presenting to the [facility] Department for STI screening  1. Screening for STD (sexually transmitted disease) Patient into clinic without symptoms. Patient declines blood work today. Patient declines pelvic exam by provider and prefers to self-collect vaginal samples. Counseled patient how to collect vaginal samples for accurate results.  Rec condoms with all sex. Await test results.  Counseled that RN will call if needs to RTC for treatment once results are back.  - WET PREP FOR TRICH, YEAST, CLUE - Gonococcus culture - Chlamydia/Gonorrhea [facility]     No follow-ups on file.  No future appointments.  Matt Holmes, PA

## 2021-06-28 ENCOUNTER — Other Ambulatory Visit: Payer: Self-pay

## 2021-06-28 ENCOUNTER — Ambulatory Visit (LOCAL_COMMUNITY_HEALTH_CENTER): Payer: Self-pay

## 2021-06-28 DIAGNOSIS — Z111 Encounter for screening for respiratory tuberculosis: Secondary | ICD-10-CM

## 2021-06-28 LAB — TB SKIN TEST
Induration: 0 mm
TB Skin Test: NEGATIVE

## 2021-06-29 LAB — GONOCOCCUS CULTURE

## 2022-02-01 ENCOUNTER — Encounter: Payer: Self-pay | Admitting: Family Medicine

## 2022-02-01 ENCOUNTER — Other Ambulatory Visit: Payer: Self-pay

## 2022-02-01 ENCOUNTER — Ambulatory Visit: Payer: Self-pay | Admitting: Family Medicine

## 2022-02-01 DIAGNOSIS — Z113 Encounter for screening for infections with a predominantly sexual mode of transmission: Secondary | ICD-10-CM

## 2022-02-01 LAB — WET PREP FOR TRICH, YEAST, CLUE
Trichomonas Exam: NEGATIVE
Yeast Exam: NEGATIVE

## 2022-02-01 NOTE — Progress Notes (Signed)
[facility] Department ?STI clinic/screening visit ? ?Subjective:  ?Angelica Fernandez is a 26 y.o. female being seen today for an STI screening visit. The patient reports they do not have symptoms.   ? ?Patient has the following medical conditions:  There are no problems to display for this patient. ? ? ? ?Chief Complaint  ?Patient presents with  ? SEXUALLY TRANSMITTED DISEASE  ? ? ?HPI ? ?Patient reports she is here for STD screen, she denies symptoms.  Her BCM is OCPs and condoms. ? ?Does the patient or their partner desires a pregnancy in the next year? No ? ?Screening for MPX risk: ?Does the patient have an unexplained rash? No ?Is the patient MSM? No ?Does the patient endorse multiple sex partners or anonymous sex partners? No ?Did the patient have close or sexual contact with a person diagnosed with MPX? No ?Has the patient traveled outside the [place] where MPX is endemic? No ?Is there a high clinical suspicion for MPX-- evidenced by one of the following No ? -Unlikely to be chickenpox ? -Lymphadenopathy ? -Rash that present in same phase of evolution on any given body part ? ? ?See flowsheet for further details and programmatic requirements.  ? ? ?The following portions of the patient's history were reviewed and updated as appropriate: allergies, current medications, past medical history, past social history, past surgical history and problem list. ? ?Objective:  ?There were no vitals filed for this visit. ? ?Physical Exam ?Constitutional:   ?   Appearance: Normal appearance.  ?HENT:  ?   Head: Normocephalic and atraumatic.  ?Pulmonary:  ?   Effort: Pulmonary effort is normal.  ?Abdominal:  ?   Palpations: Abdomen is soft.  ?Genitourinary: ?   Comments: Client performed self-collect wet prep and GC/Chlamydia ?Musculoskeletal:     ?   General: Normal range of motion.  ?Skin: ?   General: Skin is warm and dry.  ?Neurological:  ?   General: No focal deficit present.  ?   Mental Status: She is alert.   ?Psychiatric:     ?   Mood and Affect: Mood normal.     ?   Behavior: Behavior normal.  ? ? ? ? ?Assessment and Plan:  ?Angelica Fernandez is a 26 y.o. female presenting to the [facility] Department for STI screening ? ?1. Screening examination for venereal disease ? ? ?- WET PREP FOR TRICH, YEAST, CLUE ?- Chlamydia/Gonorrhea [facility] ?- Co. To use condoms for STD prevention. ? ? ?No follow-ups on file. ? ?No future appointments. ? ?Larene Pickett, FNP ?

## 2022-02-01 NOTE — Progress Notes (Signed)
Pt here for STD screening.  Wet mount results reviewed, no treatment required per SO.  Angenette Daily M Yani Coventry, RN ? ?

## 2022-02-10 ENCOUNTER — Telehealth: Payer: Self-pay

## 2022-02-10 NOTE — Telephone Encounter (Signed)
Calling pt regarding positive chlamydia and positive gonorrhea from 02/01/22 vaginal specimen. ?Pt needs tx. ?

## 2022-02-10 NOTE — Telephone Encounter (Signed)
Phone call to pt at [phone]. Left message on voicemail that RN with [facility] is calling regarding TR. Please call Arlen Legendre at [phone]. ? ?Pt immediately returned call and confirmed password.  Counseled pt regading + GC, and + CT. ?Tx appt scheduled for 02/11/22. ? ?Pt states: Using OCP, NKA, menses 01/16/22 - normal, no missed or late pills since last normal period. ?

## 2022-02-11 ENCOUNTER — Ambulatory Visit: Payer: Self-pay

## 2022-02-11 DIAGNOSIS — A749 Chlamydial infection, unspecified: Secondary | ICD-10-CM

## 2022-02-11 DIAGNOSIS — A549 Gonococcal infection, unspecified: Secondary | ICD-10-CM

## 2022-02-11 MED ORDER — DOXYCYCLINE HYCLATE 100 MG PO TABS: 100.0000 mg | ORAL_TABLET | Freq: Two times a day (BID) | ORAL | 0 refills | Status: AC

## 2022-02-11 MED ORDER — CEFTRIAXONE SODIUM 500 MG IJ SOLR
500.0000 mg | Freq: Once | INTRAMUSCULAR | Status: AC
  Administered 2022-02-11: 500 mg via INTRAMUSCULAR

## 2022-02-11 NOTE — Progress Notes (Signed)
In Nurse Clinic for gonorrhea and chlamydia treatment. NKA. LMP 02/05/2022. Last sex 01/26/2022. On no birth control. Has ocp but has not started them. Partner scheduled [facility]  STI appt 02/14/2022.  ? ?Per Elveria Rising, FNP ok to treat chlamydia with doxycycline 100 mg #14 per SO Dr Karyl Kinnier. Instructions explained to take one capsule twice daily for 7 days.  RN dispensed doxy 100 mg #14 to pt.  ? ?Treated for gonorrhea Per SO Dr Ralene Bathe with ceftriaxone 500 mg IM once. Tolerated injection well and stayed for approx 15-20 min after injection without problem. ?  ?Advised to contact [facility] with questions, concerns and if vomits within 2 hrs of taking med. Advised to take doxy with food. Jerel Shepherd, RN ? ?

## 2022-02-14 NOTE — Progress Notes (Signed)
Consulted by RN re: patient situation.  Reviewed RN note and agree that it reflects our discussion and my recommendations.  ° ° °Lauri Purdum, FNP  °

## 2022-10-11 ENCOUNTER — Ambulatory Visit: Payer: Self-pay | Admitting: Family Medicine

## 2022-10-11 ENCOUNTER — Encounter: Payer: Self-pay | Admitting: Family Medicine

## 2022-10-11 DIAGNOSIS — Z113 Encounter for screening for infections with a predominantly sexual mode of transmission: Secondary | ICD-10-CM

## 2022-10-11 LAB — WET PREP FOR TRICH, YEAST, CLUE
Trichomonas Exam: NEGATIVE
Yeast Exam: NEGATIVE

## 2022-10-11 NOTE — Progress Notes (Signed)
Pt. seen for routine STI screening. Wet mount results reviewed with pt. Condoms declined, no tx indicated.  Ebony Cargo, RN

## 2022-10-11 NOTE — Progress Notes (Signed)
[facility] Department  STI clinic/screening visit [place] [address] [phone]  Subjective:  Kiarah Eckstein is a 26 y.o. female being seen today for an STI screening visit. The patient reports they do have symptoms.  Patient reports that they do not desire a pregnancy in the next year.   They reported they are not interested in discussing contraception today.    Patient's last menstrual period was 09/21/2022 (exact date).   Patient has the following medical conditions:  There are no problems to display for this patient.   Chief Complaint  Patient presents with   SEXUALLY TRANSMITTED DISEASE    Screening- patient is complaining of vaginal discharge     HPI  Patient reports to clinic for STI testing. States she has vaginal discharge x 1 week. Denies odor or color.   Does the patient using douching products? No  Last HIV test per patient/review of record was No results found for: "HMHIVSCREEN" No results found for: "HIV" Patient reports last pap was No results found for: "DIAGPAP"   Screening for MPX risk: Does the patient have an unexplained rash? No Is the patient MSM? No Does the patient endorse multiple sex partners or anonymous sex partners? No Did the patient have close or sexual contact with a person diagnosed with MPX? No Has the patient traveled outside the [place] where MPX is endemic? No Is there a high clinical suspicion for MPX-- evidenced by one of the following No  -Unlikely to be chickenpox  -Lymphadenopathy  -Rash that present in same phase of evolution on any given body part See flowsheet for further details and programmatic requirements.   Immunization history:  Immunization History  Administered Date(s) Administered   HPV Quadrivalent 02/12/2013, 02/13/2013   Hepatitis A 05/22/2007, 07/25/2008   Hepatitis A, Adult 05/22/2007, 07/25/2008   Hepatitis B 10/19/95, 11/03/1995, 06/11/1996   Hepatitis B, adult  05/29/1998   Hpv-Unspecified 03/04/2013, 05/10/2013, 08/28/2013   Influenza,inj,Quad PF,6+ Mos 09/24/2018, 03/11/2020   MMR 11/20/1996, 05/29/1998, 09/27/1999   Meningococcal Conjugate 09/21/2011   PFIZER(Purple Top)SARS-COV-2 Vaccination 02/22/2020, 03/18/2020   PPD Test 07/26/2019, 06/25/2021   Td 05/22/2007, 03/04/2013   Tdap 05/22/2007, 03/04/2013, 06/11/2018   Varicella 03/22/1997, 05/29/1998, 05/22/2007     The following portions of the patient's history were reviewed and updated as appropriate: allergies, current medications, past medical history, past social history, past surgical history and problem list.  Objective:  There were no vitals filed for this visit.  Physical Exam Vitals and nursing note reviewed.  Constitutional:      Appearance: Normal appearance.  HENT:     Head: Normocephalic and atraumatic.     Mouth/Throat:     Mouth: Mucous membranes are moist.     Pharynx: Oropharynx is clear. No oropharyngeal exudate or posterior oropharyngeal erythema.  Pulmonary:     Effort: Pulmonary effort is normal.  Abdominal:     Palpations: There is no mass.     Tenderness: There is no abdominal tenderness. There is no rebound.  Lymphadenopathy:     Head:     Right side of head: No preauricular or posterior auricular adenopathy.     Left side of head: No preauricular or posterior auricular adenopathy.     Cervical: No cervical adenopathy.     Upper Body:     Right upper body: No epitrochlear adenopathy.     Left upper body: No epitrochlear adenopathy.  Skin:    General: Skin is warm and  dry.     Findings: No rash.  Neurological:     Mental Status: She is alert and oriented to person, place, and time.      Assessment and Plan:  Elbony Mcclimans is a 26 y.o. female presenting to the [place] [facility] for STI screening  1. Screening for venereal disease Patient accepted all screenings including oral, vaginal CT/GC. Patient meets criteria for  HepB screening? No. Ordered? No - not indicated Patient meets criteria for HepC screening? No. Ordered? No - not indicated  Treat wet prep per standing order Discussed time line for [facility] results and that patient will be called with positive results and encouraged patient to call if she had not heard in 2 weeks.  Counseled to return or seek care for continued or worsening symptoms Recommended condom use with all sex  Patient is currently using  female condoms.   to prevent pregnancy.    - [place], YEAST, CLUE   RTC if symptoms worsen or fail to improve.  Total time spent 20 minutes.   Sharlet Salina, [place]

## 2023-06-02 ENCOUNTER — Encounter: Payer: Self-pay | Admitting: Family Medicine

## 2023-06-02 ENCOUNTER — Ambulatory Visit: Payer: Self-pay | Admitting: Family Medicine

## 2023-06-02 DIAGNOSIS — Z113 Encounter for screening for infections with a predominantly sexual mode of transmission: Secondary | ICD-10-CM

## 2023-06-02 LAB — WET PREP FOR TRICH, YEAST, CLUE
Trichomonas Exam: NEGATIVE
Yeast Exam: NEGATIVE

## 2023-06-02 NOTE — Progress Notes (Signed)
[facility] Department  STI clinic/screening visit [address] [place] [address] [phone]  Subjective:  Angelica Fernandez is a 27 y.o. female being seen today for an STI screening visit. The patient reports they do not have symptoms.  Patient reports that they do not desire a pregnancy in the next year.   They reported they are not interested in discussing contraception today.    No LMP recorded.  Patient has the following medical conditions:  There are no problems to display for this patient.   Chief Complaint  Patient presents with   SEXUALLY TRANSMITTED DISEASE    HPI  Patient reports to clinic for STI testing. No symptoms  Does the patient using douching products? No  Last HIV test per patient/review of record was No results found for: "HMHIVSCREEN" No results found for: "HIV" Patient reports last pap was No results found for: "DIAGPAP" No results found for: "SPECADGYN"  Screening for MPX risk: Does the patient have an unexplained rash? No Is the patient MSM? No Does the patient endorse multiple sex partners or anonymous sex partners? No Did the patient have close or sexual contact with a person diagnosed with MPX? No Has the patient traveled outside the [place] where MPX is endemic? No Is there a high clinical suspicion for MPX-- evidenced by one of the following No  -Unlikely to be chickenpox  -Lymphadenopathy  -Rash that present in same phase of evolution on any given body part See flowsheet for further details and programmatic requirements.   Immunization history:  Immunization History  Administered Date(s) Administered   HPV Quadrivalent 02/12/2013, 02/13/2013   Hepatitis A 05/22/2007, 07/25/2008   Hepatitis A, Adult 05/22/2007, 07/25/2008   Hepatitis B 20-Jan-1995, 11/03/1995, 06/11/1996   Hepatitis B, ADULT 05/29/1998   Hpv-Unspecified 03/04/2013, 05/10/2013, 08/28/2013   Influenza,inj,Quad PF,6+ Mos 09/24/2018, 03/11/2020    MMR 11/20/1996, 05/29/1998, 09/27/1999   Meningococcal Conjugate 09/21/2011   PFIZER(Purple Top)SARS-COV-2 Vaccination 02/22/2020, 03/18/2020   PPD Test 07/26/2019, 06/25/2021   Td 05/22/2007, 03/04/2013   Tdap 05/22/2007, 03/04/2013, 06/11/2018   Varicella 03/22/1997, 05/29/1998, 05/22/2007     The following portions of the patient's history were reviewed and updated as appropriate: allergies, current medications, past medical history, past social history, past surgical history and problem list.  Objective:  There were no vitals filed for this visit.  Physical Exam Vitals and nursing note reviewed.  Constitutional:      Appearance: Normal appearance.  HENT:     Head: Normocephalic and atraumatic.     Mouth/Throat:     Mouth: Mucous membranes are moist.     Pharynx: Oropharynx is clear. No oropharyngeal exudate or posterior oropharyngeal erythema.  Pulmonary:     Effort: Pulmonary effort is normal.  Abdominal:     General: Abdomen is flat.     Palpations: There is no mass.     Tenderness: There is no abdominal tenderness. There is no rebound.  Genitourinary:    Comments: Declined genital exam- self swabbed Lymphadenopathy:     Head:     Right side of head: No preauricular or posterior auricular adenopathy.     Left side of head: No preauricular or posterior auricular adenopathy.     Cervical: No cervical adenopathy.     Upper Body:     Right upper body: No supraclavicular, axillary or epitrochlear adenopathy.     Left upper body: No supraclavicular, axillary or epitrochlear adenopathy.  Skin:    General: Skin is warm  and dry.     Findings: No rash.  Neurological:     Mental Status: She is alert and oriented to person, place, and time.      Assessment and Plan:  Angelica Fernandez is a 27 y.o. female presenting to the [facility] Department for STI screening  1. Screening for venereal disease  - Gonococcus culture - Chlamydia/Gonorrhea [place]  Lab - WET PREP FOR TRICH, YEAST, CLUE   Patient accepted screenings including oral, vaginal CT/GC and wet prep. Patient meets criteria for HepB screening? No. Ordered? not applicable Patient meets criteria for HepC screening? No. Ordered? not applicable  Treat wet prep per standing order Discussed time line for State Lab results and that patient will be called with positive results and encouraged patient to call if she had not heard in 2 weeks.  Counseled to return or seek care for continued or worsening symptoms Recommended repeat testing in 3 months with positive results. Recommended condom use with all sex  Patient is currently using Hormonal Contraception: Injection, Rings and Patches to prevent pregnancy.    Return if symptoms worsen or fail to improve, for STI screening.  No future appointments. Total time spent 20 minutes Lenice Llamas, [place]

## 2023-06-02 NOTE — Progress Notes (Addendum)
Pt is here for STD visit.  Wet prep results reviewed with pt, no treatment required per standing order.  Condoms declined.  Gaspar Garbe, RN

## 2023-06-07 LAB — GONOCOCCUS CULTURE
